# Patient Record
Sex: Female | Born: 1975 | Race: White | Hispanic: No | Marital: Married | Smoking: Never smoker
Health system: Southern US, Community
[De-identification: ages and names within clinical notes are randomized; demographics above are authoritative.]

---

## 1998-02-11 ENCOUNTER — Other Ambulatory Visit: Admission: RE | Admit: 1998-02-11 | Discharge: 1998-02-11 | Payer: Self-pay | Admitting: Obstetrics and Gynecology

## 1999-02-17 ENCOUNTER — Other Ambulatory Visit: Admission: RE | Admit: 1999-02-17 | Discharge: 1999-02-17 | Payer: Self-pay | Admitting: Obstetrics and Gynecology

## 2000-02-24 ENCOUNTER — Other Ambulatory Visit: Admission: RE | Admit: 2000-02-24 | Discharge: 2000-02-24 | Payer: Self-pay | Admitting: Obstetrics and Gynecology

## 2001-05-08 ENCOUNTER — Other Ambulatory Visit: Admission: RE | Admit: 2001-05-08 | Discharge: 2001-05-08 | Payer: Self-pay | Admitting: Obstetrics and Gynecology

## 2002-05-24 ENCOUNTER — Other Ambulatory Visit: Admission: RE | Admit: 2002-05-24 | Discharge: 2002-05-24 | Payer: Self-pay | Admitting: Obstetrics and Gynecology

## 2003-05-17 ENCOUNTER — Other Ambulatory Visit: Admission: RE | Admit: 2003-05-17 | Discharge: 2003-05-17 | Payer: Self-pay | Admitting: Obstetrics and Gynecology

## 2004-08-23 ENCOUNTER — Inpatient Hospital Stay (HOSPITAL_COMMUNITY): Admission: AD | Admit: 2004-08-23 | Discharge: 2004-08-25 | Payer: Self-pay | Admitting: Obstetrics & Gynecology

## 2004-09-09 ENCOUNTER — Encounter: Admission: RE | Admit: 2004-09-09 | Discharge: 2004-09-17 | Payer: Self-pay | Admitting: Orthopedic Surgery

## 2008-05-07 ENCOUNTER — Encounter: Admission: RE | Admit: 2008-05-07 | Discharge: 2008-05-07 | Payer: Self-pay | Admitting: Obstetrics & Gynecology

## 2008-07-13 ENCOUNTER — Inpatient Hospital Stay (HOSPITAL_COMMUNITY): Admission: AD | Admit: 2008-07-13 | Discharge: 2008-07-13 | Payer: Self-pay | Admitting: Obstetrics

## 2008-07-19 ENCOUNTER — Inpatient Hospital Stay (HOSPITAL_COMMUNITY): Admission: AD | Admit: 2008-07-19 | Discharge: 2008-07-21 | Payer: Self-pay | Admitting: Obstetrics & Gynecology

## 2010-06-16 LAB — CBC
MCHC: 34.4 g/dL (ref 30.0–36.0)
MCV: 86.3 fL (ref 78.0–100.0)
Platelets: 225 10*3/uL (ref 150–400)
Platelets: 286 10*3/uL (ref 150–400)
RDW: 13.8 % (ref 11.5–15.5)
WBC: 12.5 10*3/uL — ABNORMAL HIGH (ref 4.0–10.5)
WBC: 13.2 10*3/uL — ABNORMAL HIGH (ref 4.0–10.5)

## 2010-06-16 LAB — RPR: RPR Ser Ql: NONREACTIVE

## 2010-07-21 NOTE — H&P (Signed)
Sheila Olson, Sheila Olson NO.:  [id number]   MEDICAL RECORD NO.:  [id number]          PATIENT TYPE:  MAT   LOCATION:  [facility]                          FACILITY:  [facility]   PHYSICIAN:  Lenoard Aden, M.D.DATE OF BIRTH:  Oct 17, 1975   DATE OF ADMISSION:  07/13/2008  DATE OF DISCHARGE:  07/13/2008                              HISTORY & PHYSICAL   CHIEF COMPLAINT:  Labor.   She is a 35 year old female G2, P1 at [redacted] weeks gestation who presents  with increasing contractions today.  She has allergies to no known  medications or drug allergies.  She is a nonsmoker, nondrinker.  Denies  domestic physical violence.   MEDICAL PROBLEMS:  Include gestational diabetes, migraine headaches.   FAMILY HISTORY:  Depression, thyroid disease, __________, diabetes,  prostate cancer and stroke.   Previous history of __________a child born at 69 weeks.   PHYSICAL EXAMINATION:  GENERAL:  She is a well-developed, well-nourished  white female in no acute distress.  HEENT:  Normal.  LUNGS:  Clear.  HEART:  Regular.  ABDOMEN:  Soft and gravid.  Estimated fetal weight is 7-1/2 to 8 pounds.  Cervix is 2, 50% vertex, -1.  EXTREMITIES:  There are no cords.  NEUROLOGIC:  Nonfocal.  SKIN:  Intact.   NST is reactive.  The patient walks for 1 hour and is rechecked.  No  cervical changes noted.   IMPRESSION:  Prodromal labor at 38 weeks.   PLAN:  Discharge home.  Specific labor warnings discussed, light  activity was discussed.  Follow up in the office as scheduled.      Lenoard Aden, M.D.  Electronically Signed     RJT/MEDQ  D:  07/13/2008  T:  07/14/2008  Job:  [id number]

## 2016-11-29 ENCOUNTER — Other Ambulatory Visit: Payer: Self-pay | Admitting: Obstetrics & Gynecology

## 2016-11-29 NOTE — Telephone Encounter (Signed)
Former [employer] [facility] patient. Last annual 09/29/2015.  Scheduled to see you here at [facility] on 12/20/16. Request is for 90 day supply.

## 2016-12-20 ENCOUNTER — Encounter: Payer: Self-pay | Admitting: Obstetrics & Gynecology

## 2016-12-20 ENCOUNTER — Ambulatory Visit (INDEPENDENT_AMBULATORY_CARE_PROVIDER_SITE_OTHER): Payer: BLUE CROSS/BLUE SHIELD | Admitting: Obstetrics & Gynecology

## 2016-12-20 VITALS — BP 122/80 | Ht 64.5 in | Wt 163.0 lb

## 2016-12-20 DIAGNOSIS — Z01419 Encounter for gynecological examination (general) (routine) without abnormal findings: Secondary | ICD-10-CM | POA: Diagnosis not present

## 2016-12-20 DIAGNOSIS — Z3041 Encounter for surveillance of contraceptive pills: Secondary | ICD-10-CM | POA: Diagnosis not present

## 2016-12-20 DIAGNOSIS — Z1151 Encounter for screening for human papillomavirus (HPV): Secondary | ICD-10-CM | POA: Diagnosis not present

## 2016-12-20 MED ORDER — ORTHO TRI-CYCLEN LO 0.18/0.215/0.25 MG-25 MCG PO TABS: 1.0000 | ORAL_TABLET | Freq: Every day | ORAL | 4 refills | Status: DC

## 2016-12-20 NOTE — Patient Instructions (Signed)
1. Encounter for routine gynecological examination with Papanicolaou smear of cervix Normal gyn exam.  Pap/HPV done.  Breasts wnl.  Will schedule screening Mammo at 41 yo, 05/2017.  2. Encounter for surveillance of contraceptive pills Ortho Tri-Cyclen Lo represcribed.    Sheila Olson, it was a pleasure to see you today!  I will inform you of your results as soon as available.  Health Maintenance, Female Adopting a healthy lifestyle and getting preventive care can go a long way to promote health and wellness. Talk with your health care provider about what schedule of regular examinations is right for you. This is a good chance for you to check in with your provider about disease prevention and staying healthy. In between checkups, there are plenty of things you can do on your own. Experts have done a lot of research about which lifestyle changes and preventive measures are most likely to keep you healthy. Ask your health care provider for more information. Weight and diet Eat a healthy diet  Be sure to include plenty of vegetables, fruits, low-fat dairy products, and lean protein.  Do not eat a lot of foods high in solid fats, added sugars, or salt.  Get regular exercise. This is one of the most important things you can do for your health. ? Most adults should exercise for at least 150 minutes each week. The exercise should increase your heart rate and make you sweat (moderate-intensity exercise). ? Most adults should also do strengthening exercises at least twice a week. This is in addition to the moderate-intensity exercise.  Maintain a healthy weight  Body mass index (BMI) is a measurement that can be used to identify possible weight problems. It estimates body fat based on height and weight. Your health care provider can help determine your BMI and help you achieve or maintain a healthy weight.  For females 58 years of age and older: ? A BMI below 18.5 is considered underweight. ? A BMI  of 18.5 to 24.9 is normal. ? A BMI of 25 to 29.9 is considered overweight. ? A BMI of 30 and above is considered obese.  Watch levels of cholesterol and blood lipids  You should start having your blood tested for lipids and cholesterol at 39 years of age, then have this test every 5 years.  You may need to have your cholesterol levels checked more often if: ? Your lipid or cholesterol levels are high. ? You are older than 39 years of age. ? You are at high risk for heart disease.  Cancer screening Lung Cancer  Lung cancer screening is recommended for adults 31-69 years old who are at high risk for lung cancer because of a history of smoking.  A yearly low-dose CT scan of the lungs is recommended for people who: ? Currently smoke. ? Have quit within the past 15 years. ? Have at least a 30-pack-year history of smoking. A pack year is smoking an average of one pack of cigarettes a day for 1 year.  Yearly screening should continue until it has been 15 years since you quit.  Yearly screening should stop if you develop a health problem that would prevent you from having lung cancer treatment.  Breast Cancer  Practice breast self-awareness. This means understanding how your breasts normally appear and feel.  It also means doing regular breast self-exams. Let your health care provider know about any changes, no matter how small.  If you are in your 20s or 30s, you should have a  clinical breast exam (CBE) by a health care provider every 1-3 years as part of a regular health exam.  If you are 24 or older, have a CBE every year. Also consider having a breast X-ray (mammogram) every year.  If you have a family history of breast cancer, talk to your health care provider about genetic screening.  If you are at high risk for breast cancer, talk to your health care provider about having an MRI and a mammogram every year.  Breast cancer gene (BRCA) assessment is recommended for women who have  family members with BRCA-related cancers. BRCA-related cancers include: ? Breast. ? Ovarian. ? Tubal. ? Peritoneal cancers.  Results of the assessment will determine the need for genetic counseling and BRCA1 and BRCA2 testing.  Cervical Cancer Your health care provider may recommend that you be screened regularly for cancer of the pelvic organs (ovaries, uterus, and vagina). This screening involves a pelvic examination, including checking for microscopic changes to the surface of your cervix (Pap test). You may be encouraged to have this screening done every 3 years, beginning at age 24.  For women ages 65-65, health care providers may recommend pelvic exams and Pap testing every 3 years, or they may recommend the Pap and pelvic exam, combined with testing for human papilloma virus (HPV), every 5 years. Some types of HPV increase your risk of cervical cancer. Testing for HPV may also be done on women of any age with unclear Pap test results.  Other health care providers may not recommend any screening for nonpregnant women who are considered low risk for pelvic cancer and who do not have symptoms. Ask your health care provider if a screening pelvic exam is right for you.  If you have had past treatment for cervical cancer or a condition that could lead to cancer, you need Pap tests and screening for cancer for at least 20 years after your treatment. If Pap tests have been discontinued, your risk factors (such as having a new sexual partner) need to be reassessed to determine if screening should resume. Some women have medical problems that increase the chance of getting cervical cancer. In these cases, your health care provider may recommend more frequent screening and Pap tests.  Colorectal Cancer  This type of cancer can be detected and often prevented.  Routine colorectal cancer screening usually begins at 39 years of age and continues through 39 years of age.  Your health care provider may  recommend screening at an earlier age if you have risk factors for colon cancer.  Your health care provider may also recommend using home test kits to check for hidden blood in the stool.  A small camera at the end of a tube can be used to examine your colon directly (sigmoidoscopy or colonoscopy). This is done to check for the earliest forms of colorectal cancer.  Routine screening usually begins at age 52.  Direct examination of the colon should be repeated every 5-10 years through 39 years of age. However, you may need to be screened more often if early forms of precancerous polyps or small growths are found.  Skin Cancer  Check your skin from head to toe regularly.  Tell your health care provider about any new moles or changes in moles, especially if there is a change in a mole's shape or color.  Also tell your health care provider if you have a mole that is larger than the size of a pencil eraser.  Always use sunscreen.  Apply sunscreen liberally and repeatedly throughout the day.  Protect yourself by wearing long sleeves, pants, a wide-brimmed hat, and sunglasses whenever you are outside.  Heart disease, diabetes, and high blood pressure  High blood pressure causes heart disease and increases the risk of stroke. High blood pressure is more likely to develop in: ? People who have blood pressure in the high end of the normal range (130-139/85-89 mm Hg). ? People who are overweight or obese. ? People who are African American.  If you are 22-53 years of age, have your blood pressure checked every 3-5 years. If you are 3 years of age or older, have your blood pressure checked every year. You should have your blood pressure measured twice-once when you are at a hospital or clinic, and once when you are not at a hospital or clinic. Record the average of the two measurements. To check your blood pressure when you are not at a hospital or clinic, you can use: ? An automated blood pressure  machine at a pharmacy. ? A home blood pressure monitor.  If you are between 70 years and 57 years old, ask your health care provider if you should take aspirin to prevent strokes.  Have regular diabetes screenings. This involves taking a blood sample to check your fasting blood sugar level. ? If you are at a normal weight and have a low risk for diabetes, have this test once every three years after 39 years of age. ? If you are overweight and have a high risk for diabetes, consider being tested at a younger age or more often. Preventing infection Hepatitis B  If you have a higher risk for hepatitis B, you should be screened for this virus. You are considered at high risk for hepatitis B if: ? You were born in a country where hepatitis B is common. Ask your health care provider which countries are considered high risk. ? Your parents were born in a high-risk country, and you have not been immunized against hepatitis B (hepatitis B vaccine). ? You have HIV or AIDS. ? You use needles to inject street drugs. ? You live with someone who has hepatitis B. ? You have had sex with someone who has hepatitis B. ? You get hemodialysis treatment. ? You take certain medicines for conditions, including cancer, organ transplantation, and autoimmune conditions.  Hepatitis C  Blood testing is recommended for: ? Everyone born from [birth year] through 1963. ? Anyone with known risk factors for hepatitis C.  Sexually transmitted infections (STIs)  You should be screened for sexually transmitted infections (STIs) including gonorrhea and chlamydia if: ? You are sexually active and are younger than 39 years of age. ? You are older than 39 years of age and your health care provider tells you that you are at risk for this type of infection. ? Your sexual activity has changed since you were last screened and you are at an increased risk for chlamydia or gonorrhea. Ask your health care provider if you are at  risk.  If you do not have HIV, but are at risk, it may be recommended that you take a prescription medicine daily to prevent HIV infection. This is called pre-exposure prophylaxis (PrEP). You are considered at risk if: ? You are sexually active and do not regularly use condoms or know the HIV status of your partner(s). ? You take drugs by injection. ? You are sexually active with a partner who has HIV.  Talk with your health care  provider about whether you are at high risk of being infected with HIV. If you choose to begin PrEP, you should first be tested for HIV. You should then be tested every 3 months for as long as you are taking PrEP. Pregnancy  If you are premenopausal and you may become pregnant, ask your health care provider about preconception counseling.  If you may become pregnant, take 400 to 800 micrograms (mcg) of folic acid every day.  If you want to prevent pregnancy, talk to your health care provider about birth control (contraception). Osteoporosis and menopause  Osteoporosis is a disease in which the bones lose minerals and strength with aging. This can result in serious bone fractures. Your risk for osteoporosis can be identified using a bone density scan.  If you are 13 years of age or older, or if you are at risk for osteoporosis and fractures, ask your health care provider if you should be screened.  Ask your health care provider whether you should take a calcium or vitamin D supplement to lower your risk for osteoporosis.  Menopause may have certain physical symptoms and risks.  Hormone replacement therapy may reduce some of these symptoms and risks. Talk to your health care provider about whether hormone replacement therapy is right for you. Follow these instructions at home:  Schedule regular health, dental, and eye exams.  Stay current with your immunizations.  Do not use any tobacco products including cigarettes, chewing tobacco, or electronic  cigarettes.  If you are pregnant, do not drink alcohol.  If you are breastfeeding, limit how much and how often you drink alcohol.  Limit alcohol intake to no more than 1 drink per day for nonpregnant women. One drink equals 12 ounces of beer, 5 ounces of wine, or 1 ounces of hard liquor.  Do not use street drugs.  Do not share needles.  Ask your health care provider for help if you need support or information about quitting drugs.  Tell your health care provider if you often feel depressed.  Tell your health care provider if you have ever been abused or do not feel safe at home. This information is not intended to replace advice given to you by your health care provider. Make sure you discuss any questions you have with your health care provider. Document Released: 09/07/2010 Document Revised: 07/31/2015 Document Reviewed: 11/26/2014 Elsevier Interactive Patient Education  [employer].

## 2016-12-20 NOTE — Progress Notes (Signed)
Sheila Olson 1975-08-15 [id number]   History:    41 y.o.G2P2 Married.  Son 83 yo, daughter 32 yo.  RP:  Established patient presenting for annual gyn exam   HPI:  Well on Tri-Lo-Sprintec.  No abnormal bleeding.  No pelvic pain.  Normal secretions.  Physically active.  Good nutrition.  BMI 27.55.  Lost 10 Lbs last 3 months and wants to loose another 10.  Breasts wnl.  Mictions/BMs wnl.  Past medical history,surgical history, family history and social history were all reviewed and documented in the EPIC chart.  Gynecologic History Patient's last menstrual period was 12/12/2016. Contraception: OCP (estrogen/progesterone) Last Pap: 2016. Results were: Neg/HPV HR neg Last mammogram: Never  Obstetric History OB History  Gravida Para Term Preterm AB Living  SAB TAB Ectopic Multiple Live Births               # Outcome Date GA Lbr Len/2nd Weight Sex Delivery Anes PTL Lv  2 Para           1 Para                ROS: A ROS was performed and pertinent positives and negatives are included in the history.  GENERAL: No fevers or chills. HEENT: No change in vision, no earache, sore throat or sinus congestion. NECK: No pain or stiffness. CARDIOVASCULAR: No chest pain or pressure. No palpitations. PULMONARY: No shortness of breath, cough or wheeze. GASTROINTESTINAL: No abdominal pain, nausea, vomiting or diarrhea, melena or bright red blood per rectum. GENITOURINARY: No urinary frequency, urgency, hesitancy or dysuria. MUSCULOSKELETAL: No joint or muscle pain, no back pain, no recent trauma. DERMATOLOGIC: No rash, no itching, no lesions. ENDOCRINE: No polyuria, polydipsia, no heat or cold intolerance. No recent change in weight. HEMATOLOGICAL: No anemia or easy bruising or bleeding. NEUROLOGIC: No headache, seizures, numbness, tingling or weakness. PSYCHIATRIC: No depression, no loss of interest in normal activity or change in sleep pattern.     Exam:   BP 122/80   Ht 5'  4.5" (1.638 m)   Wt 163 lb (73.9 kg)   LMP 12/12/2016 Comment: pill  BMI 27.55 kg/m   Body mass index is 27.55 kg/m.  General appearance : Well developed well nourished female. No acute distress HEENT: Eyes: no retinal hemorrhage or exudates,  Neck supple, trachea midline, no carotid bruits, no thyroidmegaly Lungs: Clear to auscultation, no rhonchi or wheezes, or rib retractions  Heart: Regular rate and rhythm, no murmurs or gallops Breast:Examined in sitting and supine position were symmetrical in appearance, no palpable masses or tenderness,  no skin retraction, no nipple inversion, no nipple discharge, no skin discoloration, no axillary or supraclavicular lymphadenopathy Abdomen: no palpable masses or tenderness, no rebound or guarding Extremities: no edema or skin discoloration or tenderness  Pelvic: Vulva normal  Bartholin, Urethra, Skene Glands: Within normal limits             Vagina: No gross lesions or discharge  Cervix: No gross lesions or discharge.  Pap/HPV HR done.  Uterus  AV, normal size, shape and consistency, non-tender and mobile  Adnexa  Without masses or tenderness  Anus and perineum  normal     Assessment/Plan:  41 y.o. female for annual exam   1. Encounter for routine gynecological examination with Papanicolaou smear of cervix Normal Sheila Olson  Pap/HPV done.  Breasts wnl.  Will schedule screening Mammo at 41 yo, 05/2017.  2. Encounter for surveillance of contraceptive pills Ortho Tri-Cyclen Lo represcribed.    Genia Del MD, 2:09 PM 12/20/2016

## 2016-12-20 NOTE — Addendum Note (Signed)
Addended by: Berna Spare A on: 12/20/2016 02:49 PM   Modules accepted: Orders

## 2016-12-21 LAB — PAP, TP IMAGING W/ HPV RNA, RFLX HPV TYPE 16,18/45: HPV DNA High Risk: NOT DETECTED

## 2017-06-06 ENCOUNTER — Telehealth: Payer: Self-pay

## 2017-06-06 NOTE — Telephone Encounter (Signed)
If you or her know the name of the new generic, she can try it before considering a change in BCPs.

## 2017-06-06 NOTE — Telephone Encounter (Signed)
Left detailed message in voice mail. Asked her to call me and let me know if she would like to try the generic. Dr. Mackey BirchwoodML thought that would be fine.

## 2017-06-06 NOTE — Telephone Encounter (Signed)
Patient called back in voice mail and said she would like new Rx for generic. Pharmacy said since other Rx has BRAND only on it that we will need to send new Rx. The new generic is call Tri Lo Marvia and she would like to try it. She said she does have another pack of Ortho Tricyclen Lo for next cycle but wanted to go ahead and get this figured out.

## 2017-06-06 NOTE — Telephone Encounter (Signed)
Patient said she had been taking Brand Ortho Tri-Cyclen LO because the she had reactions to the generic brands.  Pharmacist had informed her that the brand has been "discontinued".  She said he did mention to her that there is a new generic that she has not tried.  She wanted me to ask you what you recommended she do. She is considering if she should try the new generic Rx?

## 2017-06-07 MED ORDER — NORGESTIM-ETH ESTRAD TRIPHASIC 0.18/0.215/0.25 MG-25 MCG PO TABS: 1.0000 | ORAL_TABLET | Freq: Every day | ORAL | 2 refills | Status: DC

## 2017-06-07 NOTE — Telephone Encounter (Signed)
I called and left message in voice mail at pharmacy and left Rx with message about it needing to be filled with the Sheila Olson new generic since reactions to other generics.  I also sent it electronic with the same note. Patient informed.

## 2017-06-09 ENCOUNTER — Telehealth: Payer: Self-pay | Admitting: *Deleted

## 2017-06-09 DIAGNOSIS — Z1231 Encounter for screening mammogram for malignant neoplasm of breast: Secondary | ICD-10-CM

## 2017-06-09 NOTE — Telephone Encounter (Signed)
Pt called stating breast center told her she needs mammogram screening order to be scheduled. I told pt we never placed orders for this if only screening, but I will do so.pt will call and schedule.

## 2017-07-05 ENCOUNTER — Ambulatory Visit
Admission: RE | Admit: 2017-07-05 | Discharge: 2017-07-05 | Disposition: A | Discharge status: Home / Self Care | Payer: BLUE CROSS/BLUE SHIELD | Source: Ambulatory Visit | Attending: Obstetrics & Gynecology | Admitting: Obstetrics & Gynecology

## 2017-07-05 DIAGNOSIS — Z1231 Encounter for screening mammogram for malignant neoplasm of breast: Secondary | ICD-10-CM

## 2018-01-09 ENCOUNTER — Ambulatory Visit (INDEPENDENT_AMBULATORY_CARE_PROVIDER_SITE_OTHER): Payer: BLUE CROSS/BLUE SHIELD | Admitting: Obstetrics & Gynecology

## 2018-01-09 ENCOUNTER — Encounter: Payer: Self-pay | Admitting: Obstetrics & Gynecology

## 2018-01-09 VITALS — BP 126/84 | Ht 64.5 in | Wt 164.0 lb

## 2018-01-09 DIAGNOSIS — Z01419 Encounter for gynecological examination (general) (routine) without abnormal findings: Secondary | ICD-10-CM | POA: Diagnosis not present

## 2018-01-09 DIAGNOSIS — Z3041 Encounter for surveillance of contraceptive pills: Secondary | ICD-10-CM | POA: Diagnosis not present

## 2018-01-09 MED ORDER — NORGESTIM-ETH ESTRAD TRIPHASIC 0.18/0.215/0.25 MG-25 MCG PO TABS: 1.0000 | ORAL_TABLET | Freq: Every day | ORAL | 4 refills | Status: DC

## 2018-01-09 NOTE — Progress Notes (Signed)
Sheila Olson 04-14-1975 [id number]   History:    42 y.o. G2P2L2  Married.  Son is 30 and daughter 47 yo.  RP:  Established patient presenting for annual gyn exam   HPI: Well on Tri-Cyclen Lo generic.  No abnormal bleeding.  No pelvic pain.  No pain with intercourse.  Urine and bowel movements normal.  Breasts normal.  Body mass index 27.72.  Regular physical activity.  Labs with Fam MD.  Past medical history,surgical history, family history and social history were all reviewed and documented in the EPIC chart.  Gynecologic History Patient's last menstrual period was 12/19/2017. Contraception: OCP (estrogen/progesterone) Last Pap: 12/2016. Results were: Negative/HPV HR negative Last mammogram: 06/2017.  Results were: Negative Bone Density: Never Colonoscopy: Never  Obstetric History OB History  Gravida Para Term Preterm AB Living  2 2       2   SAB TAB Ectopic Multiple Live Births               # Outcome Date GA Lbr Len/2nd Weight Sex Delivery Anes PTL Lv  2 Para           1 Para              ROS: A ROS was performed and pertinent positives and negatives are included in the history.  GENERAL: No fevers or chills. HEENT: No change in vision, no earache, sore throat or sinus congestion. NECK: No pain or stiffness. CARDIOVASCULAR: No chest pain or pressure. No palpitations. PULMONARY: No shortness of breath, cough or wheeze. GASTROINTESTINAL: No abdominal pain, nausea, vomiting or diarrhea, melena or bright red blood per rectum. GENITOURINARY: No urinary frequency, urgency, hesitancy or dysuria. MUSCULOSKELETAL: No joint or muscle pain, no back pain, no recent trauma. DERMATOLOGIC: No rash, no itching, no lesions. ENDOCRINE: No polyuria, polydipsia, no heat or cold intolerance. No recent change in weight. HEMATOLOGICAL: No anemia or easy bruising or bleeding. NEUROLOGIC: No headache, seizures, numbness, tingling or weakness. PSYCHIATRIC: No depression, no loss of interest in  normal activity or change in sleep pattern.     Exam:   BP 126/84   Ht 5' 4.5" (1.638 m)   Wt 164 lb (74.4 kg)   LMP 12/19/2017 Comment: pill   BMI 27.72 kg/m   Body mass index is 27.72 kg/m.  General appearance : Well developed well nourished female. No acute distress HEENT: Eyes: no retinal hemorrhage or exudates,  Neck supple, trachea midline, no carotid bruits, no thyroidmegaly Lungs: Clear to auscultation, no rhonchi or wheezes, or rib retractions  Heart: Regular rate and rhythm, no murmurs or gallops Breast:Examined in sitting and supine position were symmetrical in appearance, no palpable masses or tenderness,  no skin retraction, no nipple inversion, no nipple discharge, no skin discoloration, no axillary or supraclavicular lymphadenopathy Abdomen: no palpable masses or tenderness, no rebound or guarding Extremities: no edema or skin discoloration or tenderness  Pelvic: Vulva: Normal             Vagina: No gross lesions or discharge  Cervix: No gross lesions or discharge  Uterus  AV, normal size, shape and consistency, non-tender and mobile  Adnexa  Without masses or tenderness  Anus: Normal   Assessment/Plan:  42 y.o. female for annual exam   1. Well female exam with routine gynecological exam Normal gynecologic exam.  Pap test negative with negative high-risk HPV October 2018.  Breasts normal.  Screening mammogram April 2019 was negative.  Body mass index 27.72.  Slightly  lower calorie diet recommended.  Aerobic physical activity 5 times a week with weightlifting every 2 days.  Health labs with family physician.  2. Encounter for surveillance of contraceptive pills Well on Tri-Cyclen Lo generic.  No contraindication to birth control pills.  Same birth control pill prescribed.  Other orders - Norgestimate-Ethinyl Estradiol Triphasic (ORTHO TRI-CYCLEN LO) 0.18/0.215/0.25 MG-25 MCG tab; Take 1 tablet by mouth daily.  Genia Del MD, 3:04 PM 01/09/2018

## 2018-01-16 ENCOUNTER — Encounter: Payer: Self-pay | Admitting: Obstetrics & Gynecology

## 2018-01-16 NOTE — Patient Instructions (Signed)
1. Well female exam with routine gynecological exam Normal gynecologic exam.  Pap test negative with negative high-risk HPV October 2018.  Breasts normal.  Screening mammogram April 2019 was negative.  Body mass index 27.72.  Slightly lower calorie diet recommended.  Aerobic physical activity 5 times a week with weightlifting every 2 days.  Health labs with family physician.  2. Encounter for surveillance of contraceptive pills Well on Tri-Cyclen Lo generic.  No contraindication to birth control pills.  Same birth control pill prescribed.  Other orders - Norgestimate-Ethinyl Estradiol Triphasic (ORTHO TRI-CYCLEN LO) 0.18/0.215/0.25 MG-25 MCG tab; Take 1 tablet by mouth daily.  Sheila Olson, it was a pleasure seeing you today!

## 2019-01-11 ENCOUNTER — Ambulatory Visit (INDEPENDENT_AMBULATORY_CARE_PROVIDER_SITE_OTHER): Payer: BC Managed Care – PPO | Admitting: Obstetrics & Gynecology

## 2019-01-11 ENCOUNTER — Other Ambulatory Visit: Payer: Self-pay

## 2019-01-11 ENCOUNTER — Encounter: Payer: Self-pay | Admitting: Obstetrics & Gynecology

## 2019-01-11 VITALS — BP 128/84 | Ht 64.5 in | Wt 177.0 lb

## 2019-01-11 DIAGNOSIS — Z3041 Encounter for surveillance of contraceptive pills: Secondary | ICD-10-CM

## 2019-01-11 DIAGNOSIS — Z01419 Encounter for gynecological examination (general) (routine) without abnormal findings: Secondary | ICD-10-CM

## 2019-01-11 DIAGNOSIS — E663 Overweight: Secondary | ICD-10-CM | POA: Diagnosis not present

## 2019-01-11 MED ORDER — NORGESTIM-ETH ESTRAD TRIPHASIC 0.18/0.215/0.25 MG-25 MCG PO TABS: 1.0000 | ORAL_TABLET | Freq: Every day | ORAL | 4 refills | Status: DC

## 2019-01-11 NOTE — Progress Notes (Signed)
Sheila Olson 02-23-1976 [id number]   History:    43 y.o. G2P2L2 Married.  Son 57 yo and daughter 31 yo.  RP:  Established patient presenting for annual gyn exam   HPI: Well on the generic of Tri-Cyclen Lo.  No breakthrough bleeding.  No pelvic pain.  No pain with intercourse.  Urine and bowel movements normal.  Breasts normal.  Body mass index 29.91.  Needs to reincrease physical activities and decrease calories and nutrition.  Health labs with family physician.  Past medical history,surgical history, family history and social history were all reviewed and documented in the EPIC chart.  Gynecologic History Patient's last menstrual period was 12/23/2018. Contraception: OCP (estrogen/progesterone) Last Pap: 12/2016. Results were: Negative/HPV HR negative Last mammogram: 06/2017. Results were: Negative Bone Density: Never Colonoscopy: Never  Obstetric History OB History  Gravida Para Term Preterm AB Living  2 2       2   SAB TAB Ectopic Multiple Live Births               # Outcome Date GA Lbr Len/2nd Weight Sex Delivery Anes PTL Lv  2 Para           1 Para              ROS: A ROS was performed and pertinent positives and negatives are included in the history.  GENERAL: No fevers or chills. HEENT: No change in vision, no earache, sore throat or sinus congestion. NECK: No pain or stiffness. CARDIOVASCULAR: No chest pain or pressure. No palpitations. PULMONARY: No shortness of breath, cough or wheeze. GASTROINTESTINAL: No abdominal pain, nausea, vomiting or diarrhea, melena or bright red blood per rectum. GENITOURINARY: No urinary frequency, urgency, hesitancy or dysuria. MUSCULOSKELETAL: No joint or muscle pain, no back pain, no recent trauma. DERMATOLOGIC: No rash, no itching, no lesions. ENDOCRINE: No polyuria, polydipsia, no heat or cold intolerance. No recent change in weight. HEMATOLOGICAL: No anemia or easy bruising or bleeding. NEUROLOGIC: No headache, seizures, numbness,  tingling or weakness. PSYCHIATRIC: No depression, no loss of interest in normal activity or change in sleep pattern.     Exam:   BP 128/84   Ht 5' 4.5" (1.638 m)   Wt 177 lb (80.3 kg)   LMP 12/23/2018 Comment: PILL  BMI 29.91 kg/m   Body mass index is 29.91 kg/m.  General appearance : Well developed well nourished female. No acute distress HEENT: Eyes: no retinal hemorrhage or exudates,  Neck supple, trachea midline, no carotid bruits, no thyroidmegaly Lungs: Clear to auscultation, no rhonchi or wheezes, or rib retractions  Heart: Regular rate and rhythm, no murmurs or gallops Breast:Examined in sitting and supine position were symmetrical in appearance, no palpable masses or tenderness,  no skin retraction, no nipple inversion, no nipple discharge, no skin discoloration, no axillary or supraclavicular lymphadenopathy Abdomen: no palpable masses or tenderness, no rebound or guarding Extremities: no edema or skin discoloration or tenderness  Pelvic: Vulva: Normal             Vagina: No gross lesions or discharge  Cervix: No gross lesions or discharge.    Uterus AV, normal size, shape and consistency, non-tender and mobile  Adnexa  Without masses or tenderness  Anus: Normal   Assessment/Plan:  43 y.o. female for annual exam   1. Well female exam with routine gynecological exam Normal gynecologic exam.  Pap test October 2018 was negative with negative high-risk HPV, no indication to repeat this year.  Breast  exam normal.  Will schedule a screening mammogram now.  Health labs with family physician.  2. Encounter for surveillance of contraceptive pills Well on the generic of Tri-Cyclen Lo.  No contraindication to continue.  Prescription sent to pharmacy.  3. Overweight (BMI 25.0-29.9) Recommend a lower calorie/carb diet such as [employer].  Aerobic physical activities 5 times a week and weightlifting every 2 days.  Other orders - cholecalciferol (VITAMIN D3) 25 MCG (1000  UT) tablet; Take 1,000 Units by mouth daily. - Norgestimate-Ethinyl Estradiol Triphasic (ORTHO TRI-CYCLEN LO) 0.18/0.215/0.25 MG-25 MCG tab; Take 1 tablet by mouth daily.  Genia Del MD, 2:56 PM 01/11/2019

## 2019-01-11 NOTE — Patient Instructions (Signed)
1. Well female exam with routine gynecological exam Normal gynecologic exam.  Pap test October 2018 was negative with negative high-risk HPV, no indication to repeat this year.  Breast exam normal.  Will schedule a screening mammogram now.  Health labs with family physician.  2. Encounter for surveillance of contraceptive pills Well on the generic of Tri-Cyclen Lo.  No contraindication to continue.  Prescription sent to pharmacy.  3. Overweight (BMI 25.0-29.9) Recommend a lower calorie/carb diet such as [employer].  Aerobic physical activities 5 times a week and weightlifting every 2 days.  Other orders - cholecalciferol (VITAMIN D3) 25 MCG (1000 UT) tablet; Take 1,000 Units by mouth daily. - Norgestimate-Ethinyl Estradiol Triphasic (ORTHO TRI-CYCLEN LO) 0.18/0.215/0.25 MG-25 MCG tab; Take 1 tablet by mouth daily.  Sheila Olson, it was a pleasure seeing you today!

## 2019-08-14 ENCOUNTER — Other Ambulatory Visit: Payer: Self-pay | Admitting: Obstetrics & Gynecology

## 2019-08-14 DIAGNOSIS — Z1231 Encounter for screening mammogram for malignant neoplasm of breast: Secondary | ICD-10-CM

## 2019-08-17 ENCOUNTER — Ambulatory Visit
Admission: RE | Admit: 2019-08-17 | Discharge: 2019-08-17 | Disposition: A | Discharge status: Home / Self Care | Payer: BLUE CROSS/BLUE SHIELD | Source: Ambulatory Visit | Attending: Obstetrics & Gynecology | Admitting: Obstetrics & Gynecology

## 2019-08-17 ENCOUNTER — Other Ambulatory Visit: Payer: Self-pay

## 2019-08-17 DIAGNOSIS — Z1231 Encounter for screening mammogram for malignant neoplasm of breast: Secondary | ICD-10-CM

## 2020-01-21 ENCOUNTER — Other Ambulatory Visit: Payer: Self-pay

## 2020-01-21 ENCOUNTER — Ambulatory Visit (INDEPENDENT_AMBULATORY_CARE_PROVIDER_SITE_OTHER): Payer: BC Managed Care – PPO | Admitting: Obstetrics & Gynecology

## 2020-01-21 ENCOUNTER — Encounter: Payer: Self-pay | Admitting: Obstetrics & Gynecology

## 2020-01-21 VITALS — BP 130/80 | Ht 65.0 in | Wt 180.0 lb

## 2020-01-21 DIAGNOSIS — Z01419 Encounter for gynecological examination (general) (routine) without abnormal findings: Secondary | ICD-10-CM

## 2020-01-21 DIAGNOSIS — E663 Overweight: Secondary | ICD-10-CM

## 2020-01-21 DIAGNOSIS — Z3041 Encounter for surveillance of contraceptive pills: Secondary | ICD-10-CM

## 2020-01-21 MED ORDER — NORGESTIM-ETH ESTRAD TRIPHASIC 0.18/0.215/0.25 MG-25 MCG PO TABS: 1.0000 | ORAL_TABLET | Freq: Every day | ORAL | 4 refills | Status: DC

## 2020-01-21 NOTE — Addendum Note (Signed)
Addended by: Berna Spare A on: 01/21/2020 03:09 PM   Modules accepted: Orders

## 2020-01-21 NOTE — Progress Notes (Signed)
Sheila Olson 23-Jan-1976 [id number]   History:    44 y.o. G2P2L2 Married.  Son 28 yo and daughter 52 yo.  RP:  Established patient presenting for annual gyn exam   HPI: Well on the generic of Tri-Cyclen Lo.  No breakthrough bleeding.  No pelvic pain.  No pain with intercourse.  Urine and bowel movements normal.  Breasts normal.  Body mass index 29.95. Needs to reincrease physical activities and decrease calories and nutrition.  Health labs with family physician.  Past medical history,surgical history, family history and social history were all reviewed and documented in the EPIC chart.  Gynecologic History Patient's last menstrual period was 12/26/2019.  Obstetric History OB History  Gravida Para Term Preterm AB Living  2 2       2   SAB TAB Ectopic Multiple Live Births               # Outcome Date GA Lbr Len/2nd Weight Sex Delivery Anes PTL Lv  2 Para           1 Para              ROS: A ROS was performed and pertinent positives and negatives are included in the history.  GENERAL: No fevers or chills. HEENT: No change in vision, no earache, sore throat or sinus congestion. NECK: No pain or stiffness. CARDIOVASCULAR: No chest pain or pressure. No palpitations. PULMONARY: No shortness of breath, cough or wheeze. GASTROINTESTINAL: No abdominal pain, nausea, vomiting or diarrhea, melena or bright red blood per rectum. GENITOURINARY: No urinary frequency, urgency, hesitancy or dysuria. MUSCULOSKELETAL: No joint or muscle pain, no back pain, no recent trauma. DERMATOLOGIC: No rash, no itching, no lesions. ENDOCRINE: No polyuria, polydipsia, no heat or cold intolerance. No recent change in weight. HEMATOLOGICAL: No anemia or easy bruising or bleeding. NEUROLOGIC: No headache, seizures, numbness, tingling or weakness. PSYCHIATRIC: No depression, no loss of interest in normal activity or change in sleep pattern.     Exam:   BP 130/80   Ht 5\' 5"  (1.651 m)   Wt 180 lb (81.6 kg)    LMP 12/26/2019 Comment: pill  BMI 29.95 kg/m   Body mass index is 29.95 kg/m.  General appearance : Well developed well nourished female. No acute distress HEENT: Eyes: no retinal hemorrhage or exudates,  Neck supple, trachea midline, no carotid bruits, no thyroidmegaly Lungs: Clear to auscultation, no rhonchi or wheezes, or rib retractions  Heart: Regular rate and rhythm, no murmurs or gallops Breast:Examined in sitting and supine position were symmetrical in appearance, no palpable masses or tenderness,  no skin retraction, no nipple inversion, no nipple discharge, no skin discoloration, no axillary or supraclavicular lymphadenopathy Abdomen: no palpable masses or tenderness, no rebound or guarding Extremities: no edema or skin discoloration or tenderness  Pelvic: Vulva: Normal             Vagina: No gross lesions or discharge  Cervix: No gross lesions or discharge.  Pap reflex done.  Uterus  AV, normal size, shape and consistency, non-tender and mobile  Adnexa  Without masses or tenderness  Anus: Normal   Assessment/Plan:  44 y.o. female for annual exam   1. Encounter for routine gynecological examination with Papanicolaou smear of cervix Normal gynecologic exam.  Pap reflex done.  Breast exam normal.  Screening mammogram June 2021 was negative.  Health labs with family physician.  2. Encounter for surveillance of contraceptive pills Well on the generic of Ortho Tri-Cyclen  low.  No contraindication.  Prescription sent to pharmacy.  3. Overweight (BMI 25.0-29.9) Recommend a lower calorie/carb diet through a reduction in portions.  Aerobic activities such as walking 5 times a week and light weightlifting recommended every 2 days.  Other orders - Norgestimate-Ethinyl Estradiol Triphasic (ORTHO TRI-CYCLEN LO) 0.18/0.215/0.25 MG-25 MCG tab; Take 1 tablet by mouth daily.  Genia Del MD, 2:45 PM 01/21/2020

## 2020-01-22 LAB — PAP IG W/ RFLX HPV ASCU

## 2021-01-21 ENCOUNTER — Other Ambulatory Visit: Payer: Self-pay

## 2021-01-21 ENCOUNTER — Ambulatory Visit (INDEPENDENT_AMBULATORY_CARE_PROVIDER_SITE_OTHER): Payer: BC Managed Care – PPO | Admitting: Obstetrics & Gynecology

## 2021-01-21 ENCOUNTER — Encounter: Payer: Self-pay | Admitting: Obstetrics & Gynecology

## 2021-01-21 VITALS — BP 110/78 | HR 78 | Resp 16 | Ht 64.75 in | Wt 185.0 lb

## 2021-01-21 DIAGNOSIS — E6609 Other obesity due to excess calories: Secondary | ICD-10-CM

## 2021-01-21 DIAGNOSIS — E66811 Other obesity due to excess calories: Secondary | ICD-10-CM

## 2021-01-21 DIAGNOSIS — Z01419 Encounter for gynecological examination (general) (routine) without abnormal findings: Secondary | ICD-10-CM | POA: Diagnosis not present

## 2021-01-21 DIAGNOSIS — Z6831 Body mass index (BMI) 31.0-31.9, adult: Secondary | ICD-10-CM

## 2021-01-21 DIAGNOSIS — Z3041 Encounter for surveillance of contraceptive pills: Secondary | ICD-10-CM | POA: Diagnosis not present

## 2021-01-21 MED ORDER — NORGESTIM-ETH ESTRAD TRIPHASIC 0.18/0.215/0.25 MG-25 MCG PO TABS: 1.0000 | ORAL_TABLET | Freq: Every day | ORAL | 4 refills | Status: DC

## 2021-01-21 NOTE — Progress Notes (Signed)
Sheila Olson [birth year] [id number]   History:    45 y.o. G2P2L2 Married.  Son 44 yo and daughter 78 yo.   RP:  Established patient presenting for annual gyn exam    HPI: Well on the generic of Tri-Cyclen Lo.  No breakthrough bleeding.  No pelvic pain.  No pain with intercourse.  Pap Neg 01/2020. Urine and bowel movements normal.  Breasts normal. Screening mammo Neg 08/2019.  Body mass index 31.02. Needs to reincrease physical activities and decrease calories and nutrition.  Health labs with family physician.   Past medical history,surgical history, family history and social history were all reviewed and documented in the EPIC chart.  Gynecologic History Patient's last menstrual period was 12/29/2020 (exact date).  Obstetric History OB History  Gravida Para Term Preterm AB Living  2 2       2   SAB IAB Ectopic Multiple Live Births               # Outcome Date GA Lbr Len/2nd Weight Sex Delivery Anes PTL Lv  2 Para           1 Para              ROS: A ROS was performed and pertinent positives and negatives are included in the history.  GENERAL: No fevers or chills. HEENT: No change in vision, no earache, sore throat or sinus congestion. NECK: No pain or stiffness. CARDIOVASCULAR: No chest pain or pressure. No palpitations. PULMONARY: No shortness of breath, cough or wheeze. GASTROINTESTINAL: No abdominal pain, nausea, vomiting or diarrhea, melena or bright red blood per rectum. GENITOURINARY: No urinary frequency, urgency, hesitancy or dysuria. MUSCULOSKELETAL: No joint or muscle pain, no back pain, no recent trauma. DERMATOLOGIC: No rash, no itching, no lesions. ENDOCRINE: No polyuria, polydipsia, no heat or cold intolerance. No recent change in weight. HEMATOLOGICAL: No anemia or easy bruising or bleeding. NEUROLOGIC: No headache, seizures, numbness, tingling or weakness. PSYCHIATRIC: No depression, no loss of interest in normal activity or change in sleep pattern.      Exam:   BP 110/78   Pulse 78   Resp 16   Ht 5' 4.75" (1.645 m)   Wt 185 lb (83.9 kg)   LMP 12/29/2020 (Exact Date)   BMI 31.02 kg/m   Body mass index is 31.02 kg/m.  General appearance : Well developed well nourished female. No acute distress HEENT: Eyes: no retinal hemorrhage or exudates,  Neck supple, trachea midline, no carotid bruits, no thyroidmegaly Lungs: Clear to auscultation, no rhonchi or wheezes, or rib retractions  Heart: Regular rate and rhythm, no murmurs or gallops Breast:Examined in sitting and supine position were symmetrical in appearance, no palpable masses or tenderness,  no skin retraction, no nipple inversion, no nipple discharge, no skin discoloration, no axillary or supraclavicular lymphadenopathy Abdomen: no palpable masses or tenderness, no rebound or guarding Extremities: no edema or skin discoloration or tenderness  Pelvic: Vulva: Normal             Vagina: No gross lesions or discharge  Cervix: No gross lesions or discharge  Uterus  AV, normal size, shape and consistency, non-tender and mobile  Adnexa  Without masses or tenderness  Anus: Normal   Assessment/Plan:  46 y.o. female for annual exam   1. Well female exam with routine gynecological exam Well on the generic of Tri-Cyclen Lo.  No breakthrough bleeding.  No pelvic pain.  No pain with intercourse.  Pap Neg 01/2020. Urine  and bowel movements normal.  Breasts normal. Screening mammo Neg 08/2019.  Body mass index 31.02. Needs to reincrease physical activities and decrease calories and nutrition.  Health labs with family physician.  2. Encounter for surveillance of contraceptive pills Well on Norgestimate-Ethinyl Estradiol Triphasic (ORTHO TRI-CYCLEN LO) 0.18/0.215/0.25 MG-25 MCG.  No CI to continue.  Prescription sent to pharmacy.  3. Class 1 obesity due to excess calories without serious comorbidity with body mass index (BMI) of 31.0 to 31.9 in adult Body mass index 31.02. Needs to  reincrease physical activities and decrease calories and nutrition.   Other orders - Norgestimate-Ethinyl Estradiol Triphasic (ORTHO TRI-CYCLEN LO) 0.18/0.215/0.25 MG-25 MCG tab; Take 1 tablet by mouth daily.   Genia Del MD, 2:53 PM 01/21/2021

## 2021-05-30 IMAGING — MG DIGITAL SCREENING BILAT W/ TOMO W/ CAD
6 of 10 series · 6 of 30 positions shown · non-contrast
Comparison: Previous exam(s).

CLINICAL DATA: Screening.

EXAM:
DIGITAL SCREENING BILATERAL MAMMOGRAM WITH TOMO AND CAD

[R MLO synth-2D (1 of 2)]
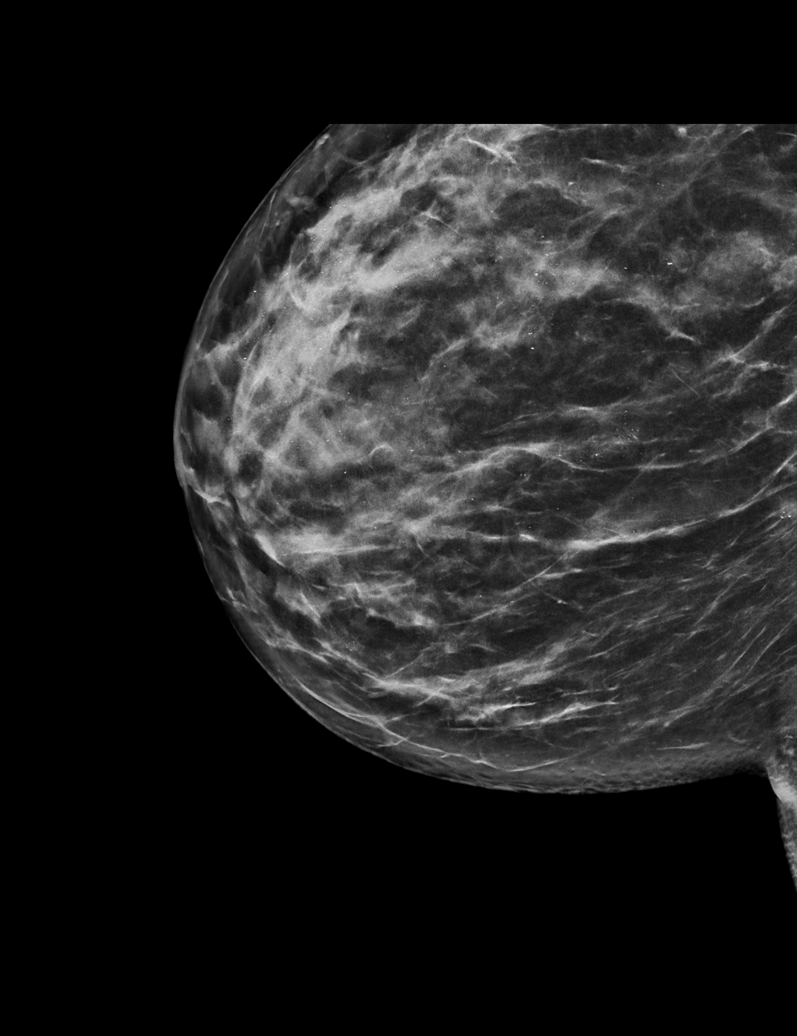

[R MLO synth-2D (2 of 2)]
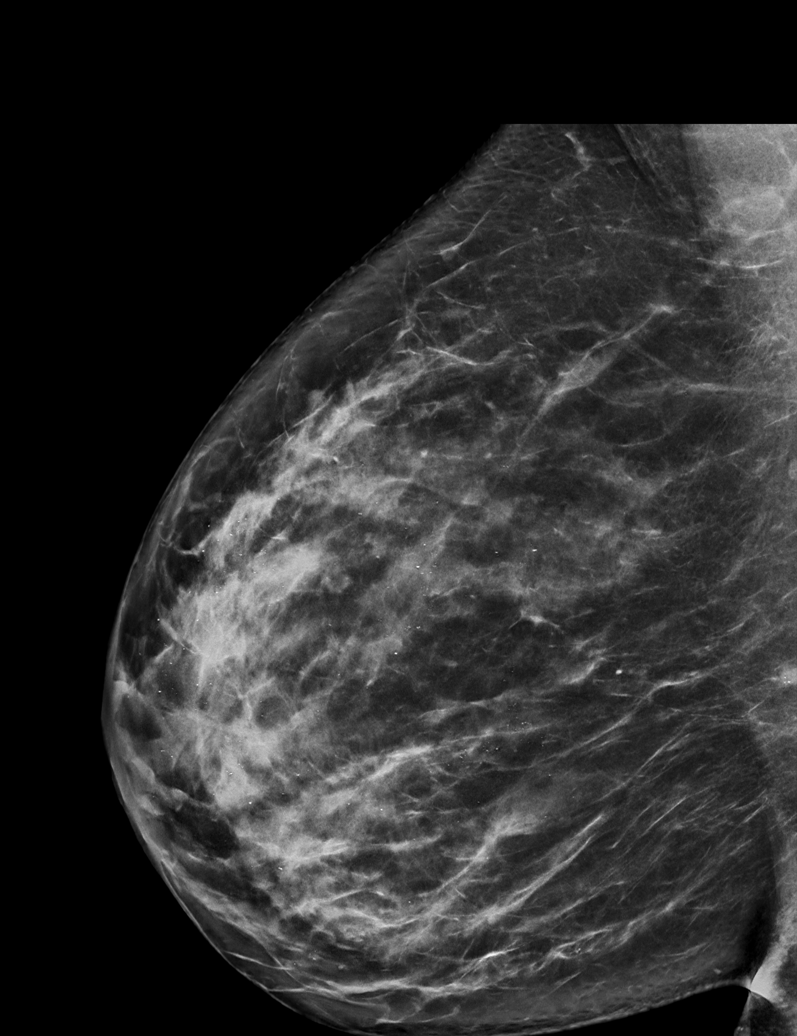

[L MLO synth-2D]
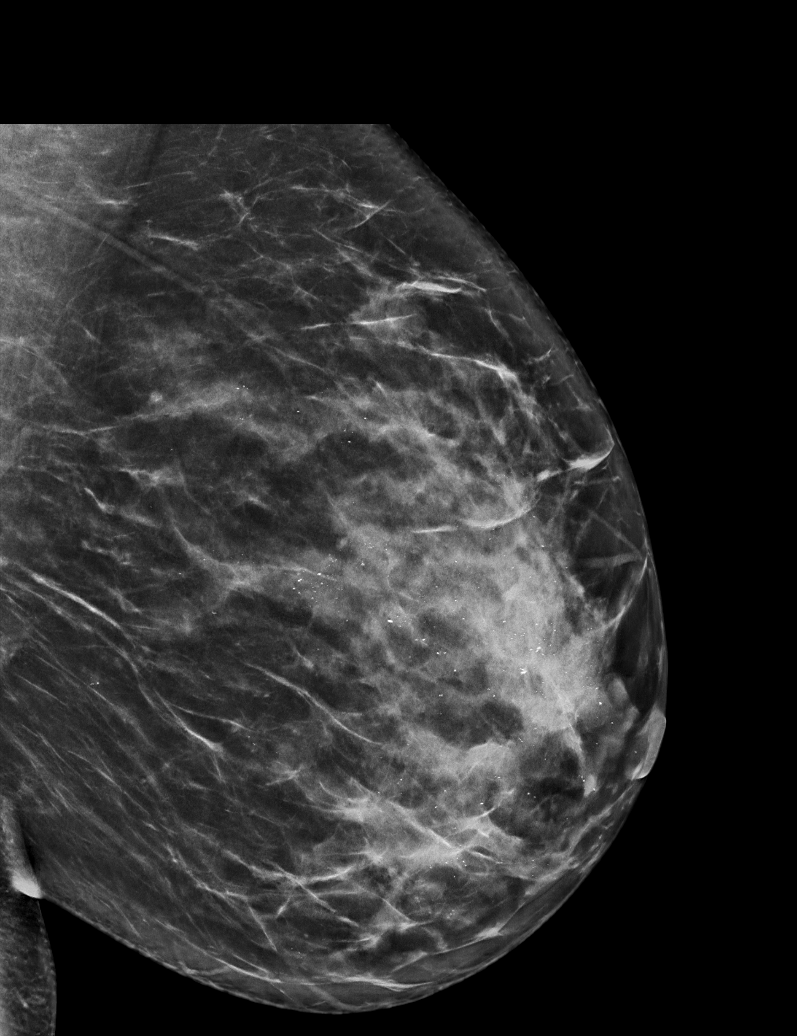

[R CC synth-2D]
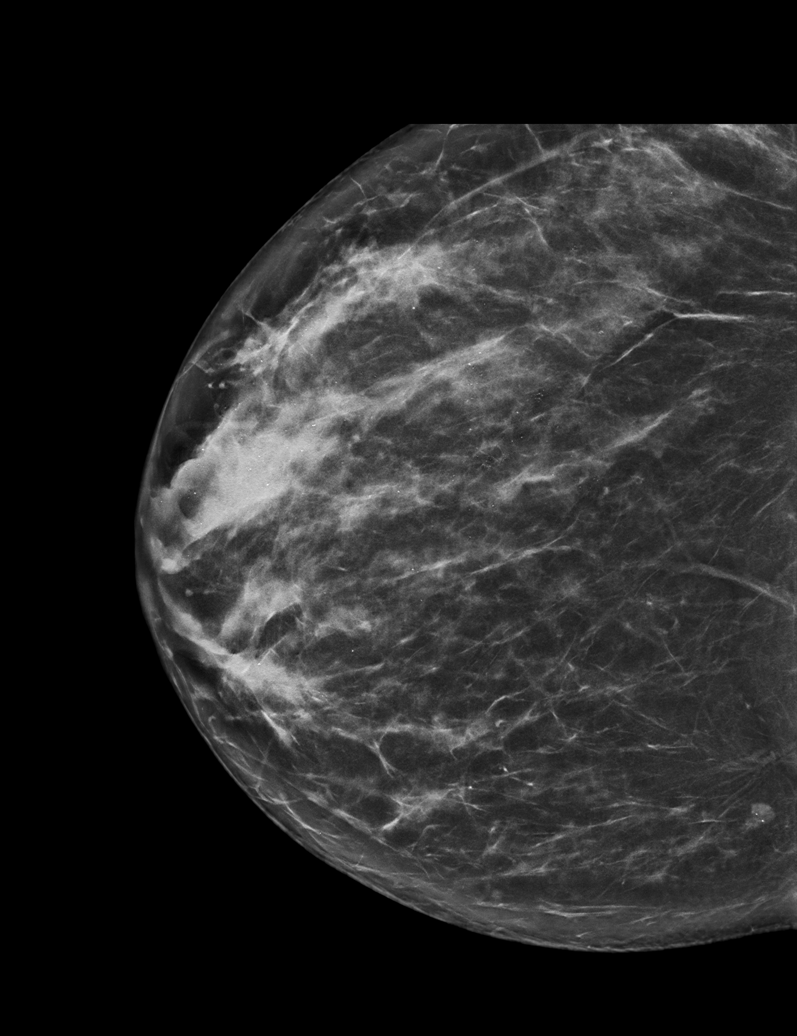

[L CC synth-2D]
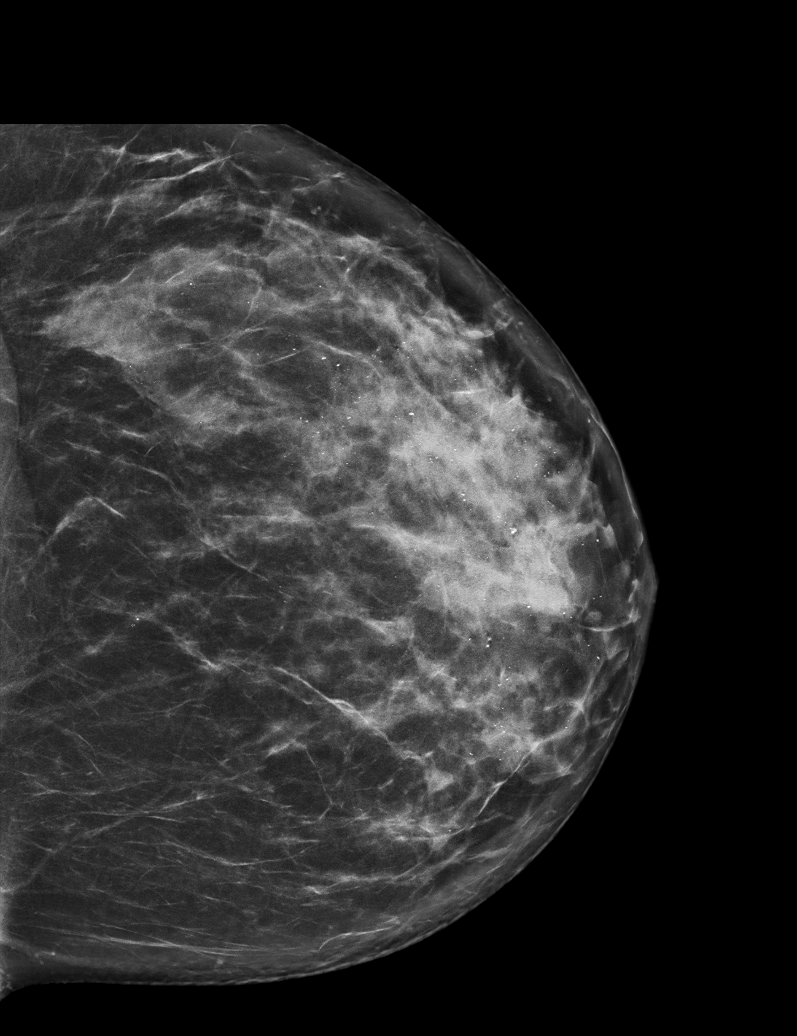

[R MLO tomo · tomo slice 40/79.0]
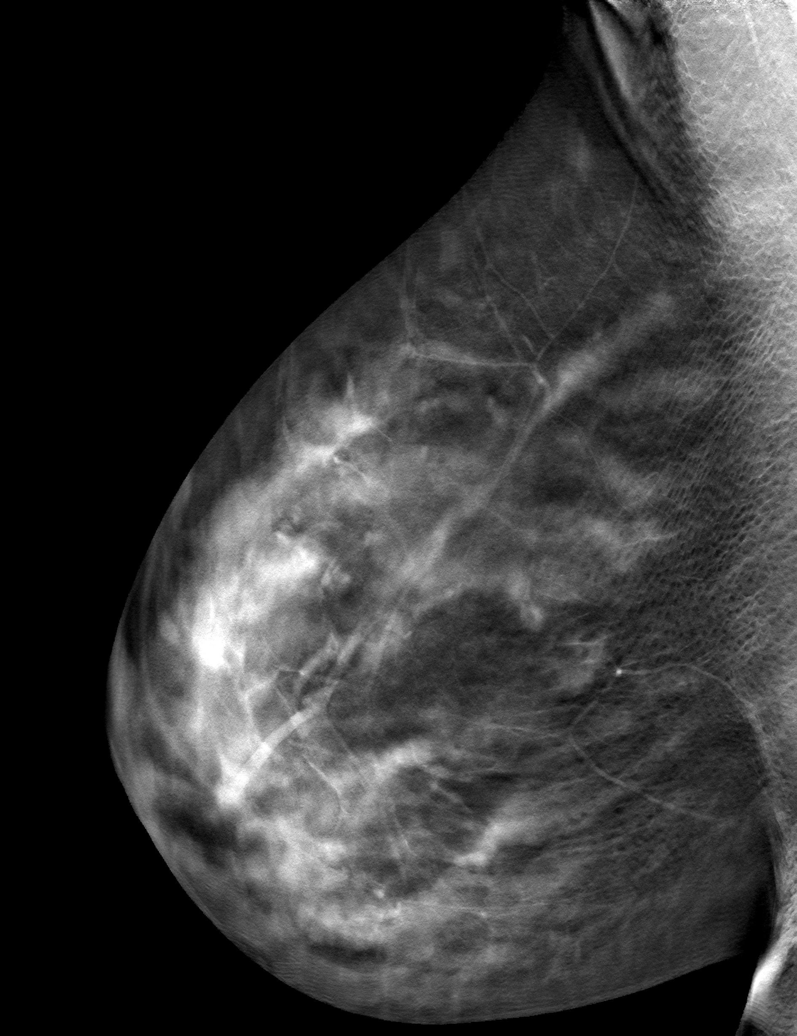

[6 of 30 positions shown; findings below may reference images not displayed]

ACR Breast Density Category c: The breast tissue is heterogeneously
dense, which may obscure small masses.
FINDINGS: There are no findings suspicious for malignancy. Images were
processed with CAD.
IMPRESSION: No mammographic evidence of malignancy. A result letter of this
screening mammogram will be mailed directly to the patient.

RECOMMENDATION:
Screening mammogram in one year. (Code:FT-U-LHB)

BI-RADS CATEGORY  1: Negative.

## 2021-09-10 ENCOUNTER — Other Ambulatory Visit: Payer: Self-pay | Admitting: Obstetrics & Gynecology

## 2021-09-10 DIAGNOSIS — Z1231 Encounter for screening mammogram for malignant neoplasm of breast: Secondary | ICD-10-CM

## 2021-09-17 ENCOUNTER — Ambulatory Visit
Admission: RE | Admit: 2021-09-17 | Discharge: 2021-09-17 | Disposition: A | Discharge status: Home / Self Care | Payer: BC Managed Care – PPO | Source: Ambulatory Visit | Attending: Obstetrics & Gynecology | Admitting: Obstetrics & Gynecology

## 2021-09-17 DIAGNOSIS — Z1231 Encounter for screening mammogram for malignant neoplasm of breast: Secondary | ICD-10-CM

## 2022-01-22 ENCOUNTER — Encounter: Payer: Self-pay | Admitting: Obstetrics & Gynecology

## 2022-01-22 ENCOUNTER — Ambulatory Visit (INDEPENDENT_AMBULATORY_CARE_PROVIDER_SITE_OTHER): Payer: BC Managed Care – PPO | Admitting: Obstetrics & Gynecology

## 2022-01-22 VITALS — BP 118/80 | HR 80 | Ht 64.75 in | Wt 176.0 lb

## 2022-01-22 DIAGNOSIS — Z3041 Encounter for surveillance of contraceptive pills: Secondary | ICD-10-CM

## 2022-01-22 DIAGNOSIS — Z01419 Encounter for gynecological examination (general) (routine) without abnormal findings: Secondary | ICD-10-CM | POA: Diagnosis not present

## 2022-01-22 MED ORDER — NORGESTIM-ETH ESTRAD TRIPHASIC 0.18/0.215/0.25 MG-25 MCG PO TABS: 1.0000 | ORAL_TABLET | Freq: Every day | ORAL | 4 refills | Status: AC

## 2022-01-22 NOTE — Progress Notes (Signed)
Arv[place] Seaborn Pro May 11, 1975 [id number]   History:    46 y.o. G2P2L2 Married.  Son 46 yo and daughter 55 yo.  Going on a [employer] cruise tomorrow! :)))   RP:  Established patient presenting for annual gyn exam    HPI: Well on the generic of Tri-Cyclen Lo.  No breakthrough bleeding. No pelvic pain.  No pain with intercourse.  Pap Neg 01/2020. No h/o abnormal Pap. Will repeat Pap at 3 years.  Urine and bowel movements normal.  Breasts normal. Screening mammo Neg 09/2021.  Body mass index decreased to 29.51. Increased physical activities and decreased calories/carbs in nutrition.  Health labs with family physician.  Flu vaccine declined   Past medical history,surgical history, family history and social history were all reviewed and documented in the EPIC chart.  Gynecologic History Patient's last menstrual period was 01/03/2022 (exact date).  Obstetric History OB History  Gravida Para Term Preterm AB Living  2 2 2     2   SAB IAB Ectopic Multiple Live Births               # Outcome Date GA Lbr Len/2nd Weight Sex Delivery Anes PTL Lv  2 Term           1 Term              ROS: A ROS was performed and pertinent positives and negatives are included in the history. GENERAL: No fevers or chills. HEENT: No change in vision, no earache, sore throat or sinus congestion. NECK: No pain or stiffness. CARDIOVASCULAR: No chest pain or pressure. No palpitations. PULMONARY: No shortness of breath, cough or wheeze. GASTROINTESTINAL: No abdominal pain, nausea, vomiting or diarrhea, melena or bright red blood per rectum. GENITOURINARY: No urinary frequency, urgency, hesitancy or dysuria. MUSCULOSKELETAL: No joint or muscle pain, no back pain, no recent trauma. DERMATOLOGIC: No rash, no itching, no lesions. ENDOCRINE: No polyuria, polydipsia, no heat or cold intolerance. No recent change in weight. HEMATOLOGICAL: No anemia or easy bruising or bleeding. NEUROLOGIC: No headache, seizures, numbness, tingling  or weakness. PSYCHIATRIC: No depression, no loss of interest in normal activity or change in sleep pattern.     Exam:   BP 118/80   Pulse 80   Ht 5' 4.75" (1.645 m)   Wt 176 lb (79.8 kg)   LMP 01/03/2022 (Exact Date) Comment: ocp  SpO2 99%   BMI 29.51 kg/m   Body mass index is 29.51 kg/m.  General appearance : Well developed well nourished female. No acute distress HEENT: Eyes: no retinal hemorrhage or exudates,  Neck supple, trachea midline, no carotid bruits, no thyroidmegaly Lungs: Clear to auscultation, no rhonchi or wheezes, or rib retractions  Heart: Regular rate and rhythm, no murmurs or gallops Breast:Examined in sitting and supine position were symmetrical in appearance, no palpable masses or tenderness,  no skin retraction, no nipple inversion, no nipple discharge, no skin discoloration, no axillary or supraclavicular lymphadenopathy Abdomen: no palpable masses or tenderness, no rebound or guarding Extremities: no edema or skin discoloration or tenderness  Pelvic: Vulva: Normal             Vagina: No gross lesions or discharge  Cervix: No gross lesions or discharge  Uterus  AV, normal size, shape and consistency, non-tender and mobile  Adnexa  Without masses or tenderness  Anus: Normal   Assessment/Plan:  46 y.o. female for annual exam   1. Well female exam with routine gynecological exam Well on the  generic of Tri-Cyclen Lo.  No breakthrough bleeding. No pelvic pain.  No pain with intercourse.  Pap Neg 01/2020. No h/o abnormal Pap. Will repeat Pap at 3 years.  Urine and bowel movements normal.  Breasts normal. Screening mammo Neg 09/2021.  Body mass index decreased to 29.51. Increased physical activities and decreased calories/carbs in nutrition.  Health labs with family physician.  Flu vaccine declined.  2. Encounter for surveillance of contraceptive pills Well on the generic of Tri-Cyclen Lo.  No breakthrough bleeding. No pelvic pain.  No pain with intercourse. No  CI to BCPs.  Prescription sent to pharmacy.  Other orders - Norgestimate-Ethinyl Estradiol Triphasic (ORTHO TRI-CYCLEN LO) 0.18/0.215/0.25 MG-25 MCG tab; Take 1 tablet by mouth daily.   Genia Del MD, 3:03 PM 01/22/2022

## 2023-01-24 ENCOUNTER — Ambulatory Visit: Payer: BC Managed Care – PPO | Admitting: Obstetrics & Gynecology

## 2023-01-24 ENCOUNTER — Ambulatory Visit: Payer: BC Managed Care – PPO | Admitting: Obstetrics and Gynecology

## 2023-01-28 ENCOUNTER — Other Ambulatory Visit: Payer: Self-pay | Admitting: Obstetrics and Gynecology

## 2023-01-28 DIAGNOSIS — R928 Other abnormal and inconclusive findings on diagnostic imaging of breast: Secondary | ICD-10-CM

## 2023-02-16 ENCOUNTER — Ambulatory Visit
Admission: RE | Admit: 2023-02-16 | Discharge: 2023-02-16 | Disposition: A | Discharge status: Home / Self Care | Payer: BC Managed Care – PPO | Source: Ambulatory Visit | Attending: Obstetrics and Gynecology | Admitting: Obstetrics and Gynecology

## 2023-02-16 DIAGNOSIS — R928 Other abnormal and inconclusive findings on diagnostic imaging of breast: Secondary | ICD-10-CM
